# Patient Record
Sex: Female | Born: 1960 | Race: White | Hispanic: No | Smoking: Never smoker
Health system: Southern US, Community
[De-identification: ages and names within clinical notes are randomized; demographics above are authoritative.]

## PROBLEM LIST (undated history)

## (undated) DIAGNOSIS — F419 Anxiety disorder, unspecified: Secondary | ICD-10-CM

## (undated) HISTORY — PX: CHOLECYSTECTOMY: SHX55

## (undated) HISTORY — PX: TUBAL LIGATION: SHX77

## (undated) HISTORY — DX: Anxiety disorder, unspecified: F41.9

---

## 2001-02-18 ENCOUNTER — Other Ambulatory Visit: Admission: RE | Admit: 2001-02-18 | Discharge: 2001-02-18 | Payer: Self-pay | Admitting: Obstetrics & Gynecology

## 2002-04-12 ENCOUNTER — Other Ambulatory Visit: Admission: RE | Admit: 2002-04-12 | Discharge: 2002-04-12 | Payer: Self-pay | Admitting: Obstetrics & Gynecology

## 2003-06-13 ENCOUNTER — Other Ambulatory Visit: Admission: RE | Admit: 2003-06-13 | Discharge: 2003-06-13 | Payer: Self-pay | Admitting: Obstetrics & Gynecology

## 2004-06-23 ENCOUNTER — Other Ambulatory Visit: Admission: RE | Admit: 2004-06-23 | Discharge: 2004-06-23 | Payer: Self-pay | Admitting: Obstetrics & Gynecology

## 2005-06-24 ENCOUNTER — Other Ambulatory Visit: Admission: RE | Admit: 2005-06-24 | Discharge: 2005-06-24 | Payer: Self-pay | Admitting: Obstetrics & Gynecology

## 2008-09-07 HISTORY — PX: COLONOSCOPY: SHX174

## 2009-12-17 ENCOUNTER — Encounter: Admission: RE | Admit: 2009-12-17 | Discharge: 2009-12-17 | Payer: Self-pay | Admitting: Obstetrics & Gynecology

## 2016-11-05 ENCOUNTER — Ambulatory Visit
Admission: RE | Admit: 2016-11-05 | Discharge: 2016-11-05 | Disposition: A | Discharge status: Home / Self Care | Payer: BC Managed Care – PPO | Source: Ambulatory Visit | Attending: Internal Medicine | Admitting: Internal Medicine

## 2016-11-05 ENCOUNTER — Other Ambulatory Visit: Payer: Self-pay | Admitting: Internal Medicine

## 2016-11-05 DIAGNOSIS — R0781 Pleurodynia: Secondary | ICD-10-CM

## 2016-11-05 DIAGNOSIS — M5135 Other intervertebral disc degeneration, thoracolumbar region: Secondary | ICD-10-CM

## 2018-02-28 IMAGING — CR DG LUMBAR SPINE COMPLETE 4+V
5 series · 5 of 5 positions shown · non-contrast
Comparison: Coronal and sagittal images from an abdominal and
pelvic CT scan dated June 02, 2016

CLINICAL DATA: Status post fall from horse 15 years ago with
intermittent pain since then with increased symptoms over the past 6
months.

EXAM:
LUMBAR SPINE - COMPLETE 4+ VIEW

[t lumbar spine ap]
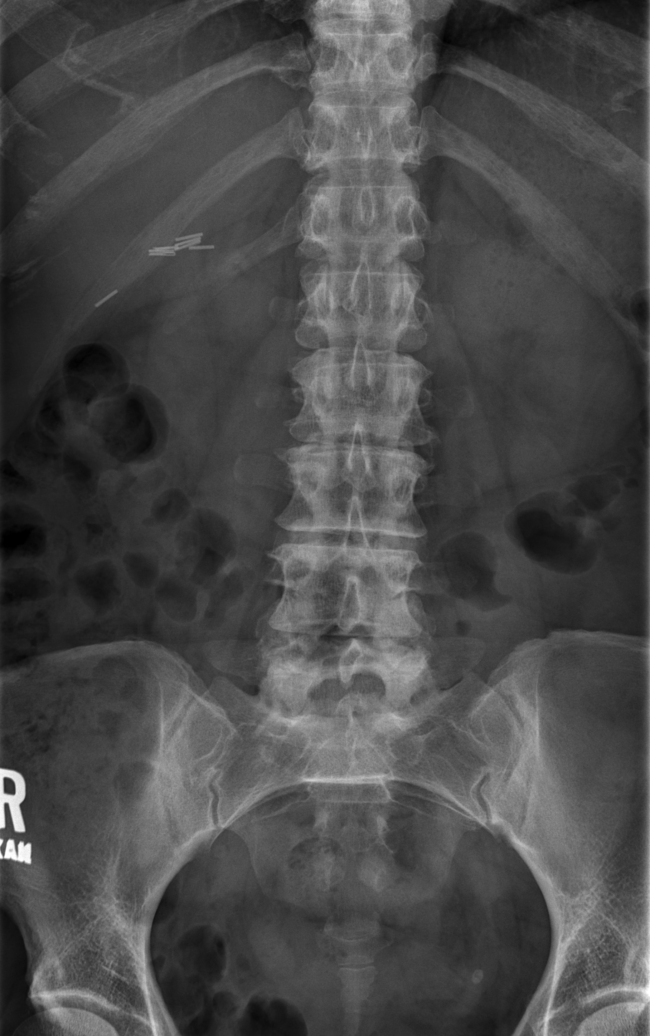

[t lumbar spine obl (1 of 2)]
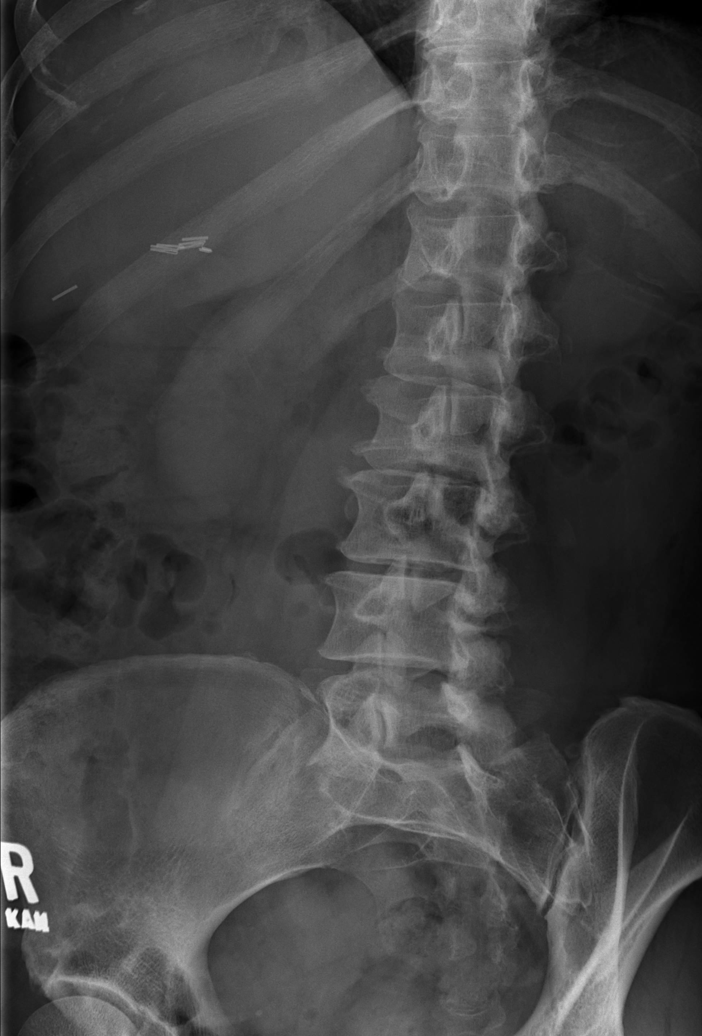

[t lumbar spine obl (2 of 2)]
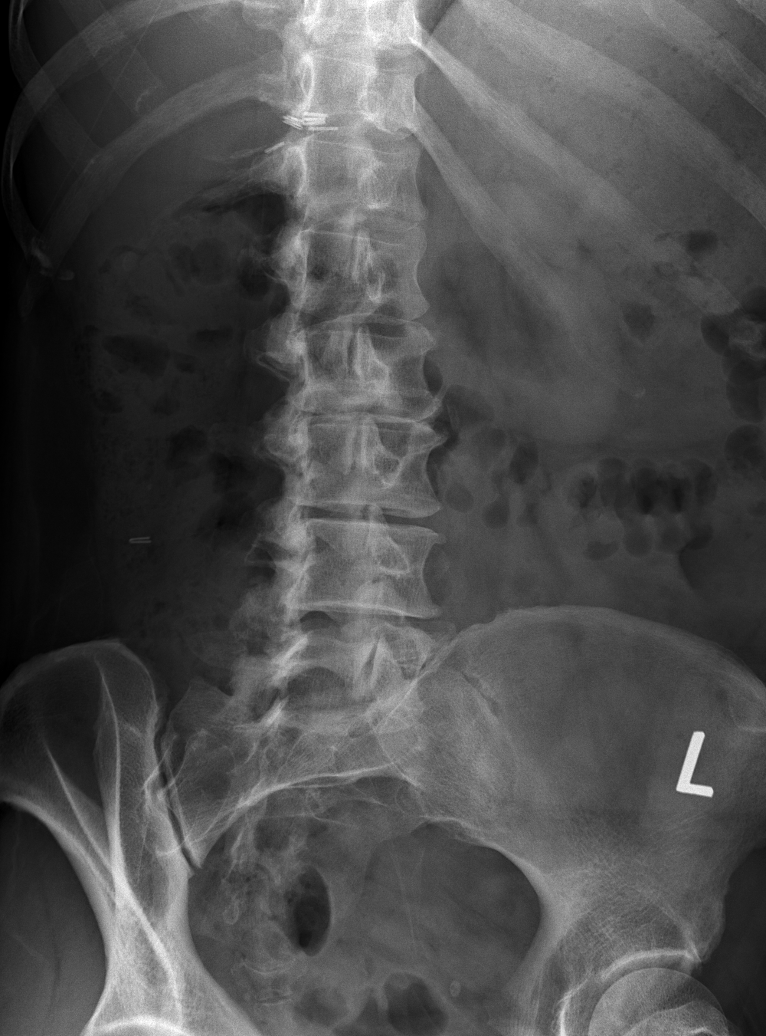

[t lumbar spine lat]
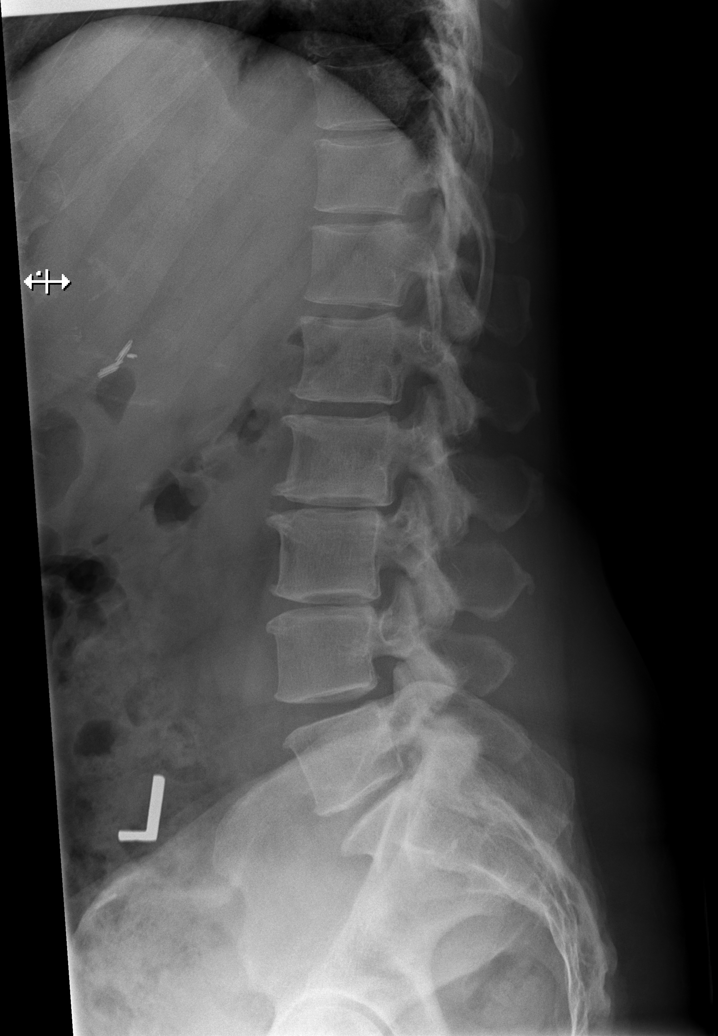

[t lumbar l-5 s-1 spot]
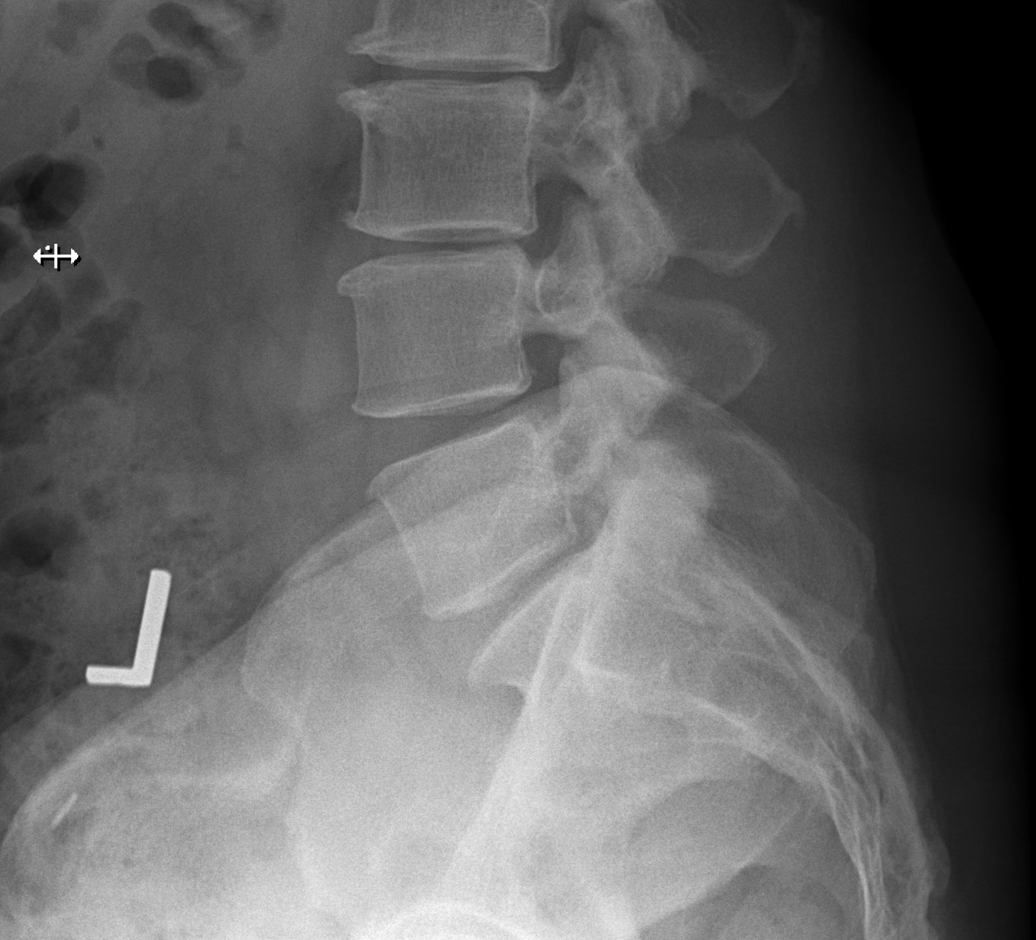

[5 of 5 positions shown; findings below may reference images not displayed]

FINDINGS: The lumbar vertebral bodies are preserved in height. There is disc
space narrowing at L1-2, L2-3, and L3-4. There are small anterior
endplate osteophytes at all lumbar levels. There is no
spondylolisthesis. There is mild facet joint hypertrophy at L5-S1.
The pedicles and transverse processes are intact. The observed
portions of the sacrum are normal.
IMPRESSION: There is multilevel degenerative disc disease of the lumbar spine.
There is no acute or old compression fracture. There is no
spondylolisthesis. If there are radicular symptoms, lumbar spine MRI
may be useful.

## 2018-02-28 IMAGING — CR DG RIBS W/ CHEST 3+V*R*
3 series · 3 of 3 positions shown · non-contrast
Comparison: Abdominal series of January 31, 2015

CLINICAL DATA: Status post fall from a worse with intermittent but
worsening pain. Symptoms are centered over the last palpable rib on
the right. No new injury.

EXAM:
RIGHT RIBS AND CHEST - 3+ VIEW

[t ribs ap lower right]
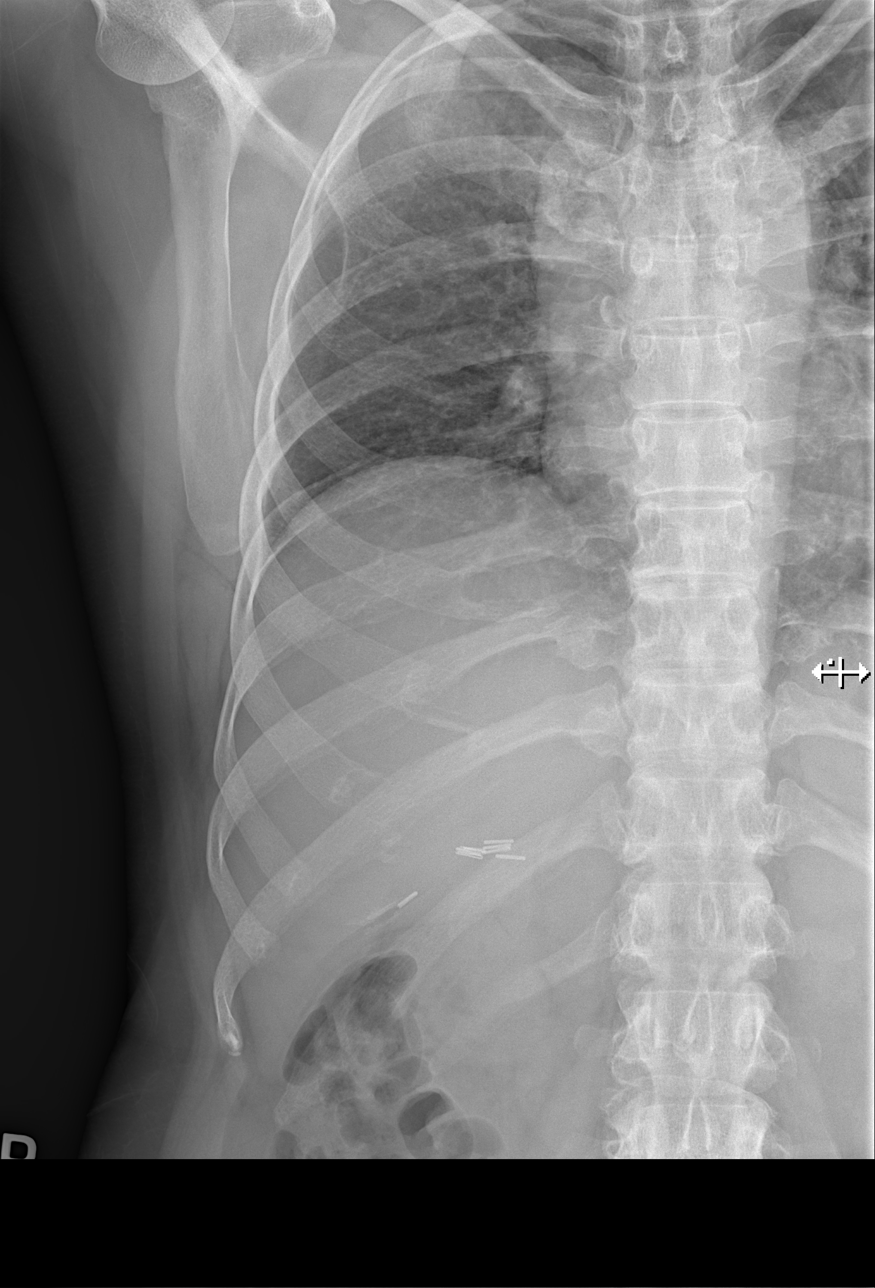

[t ribs rpo right]
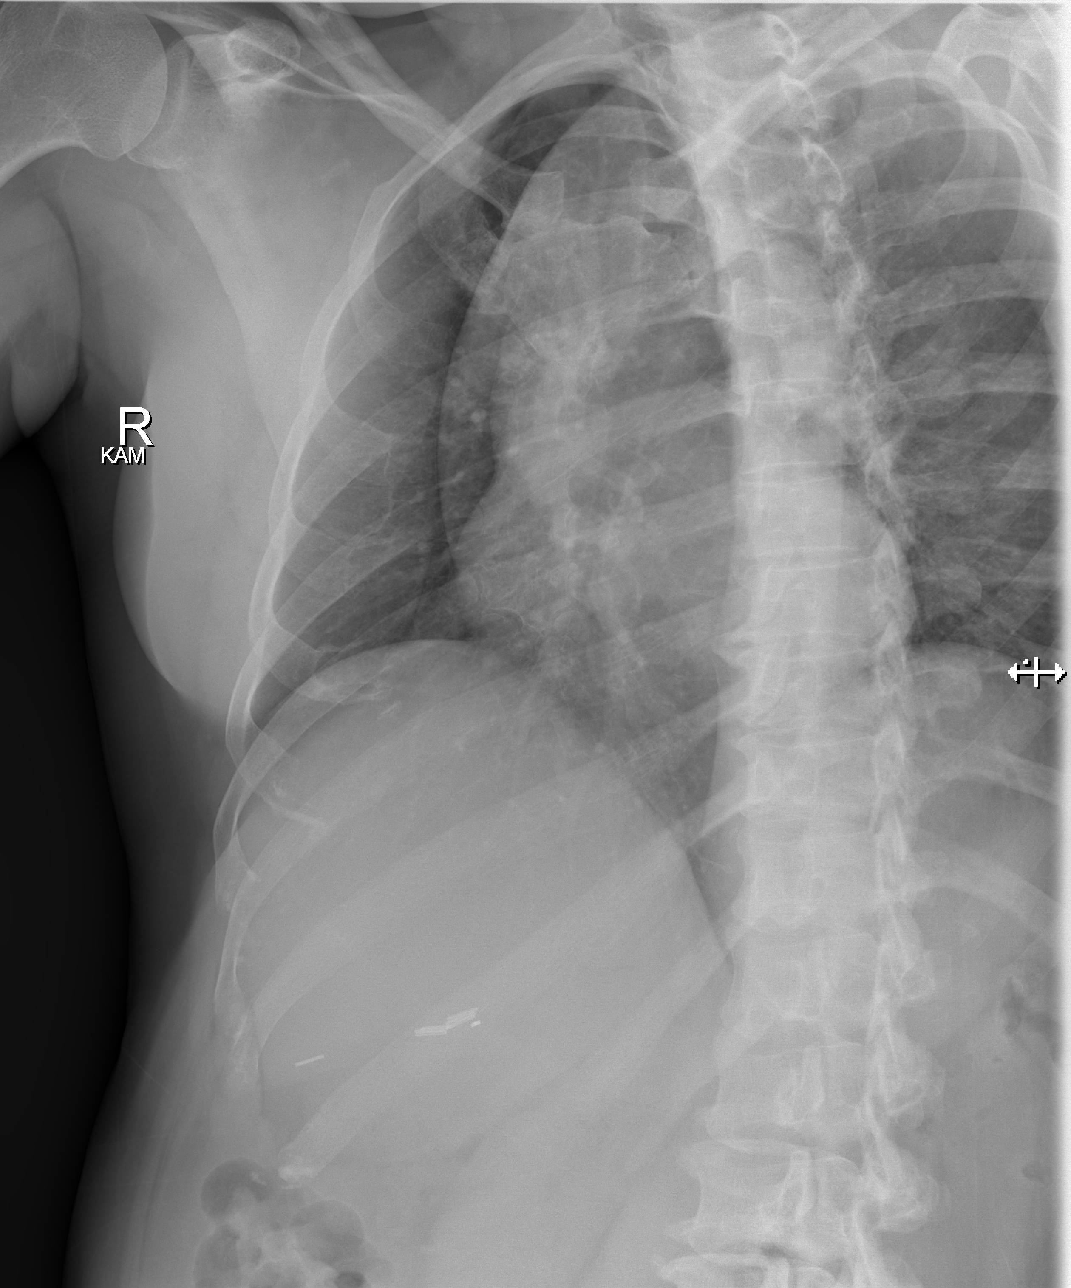

[w chest pa]
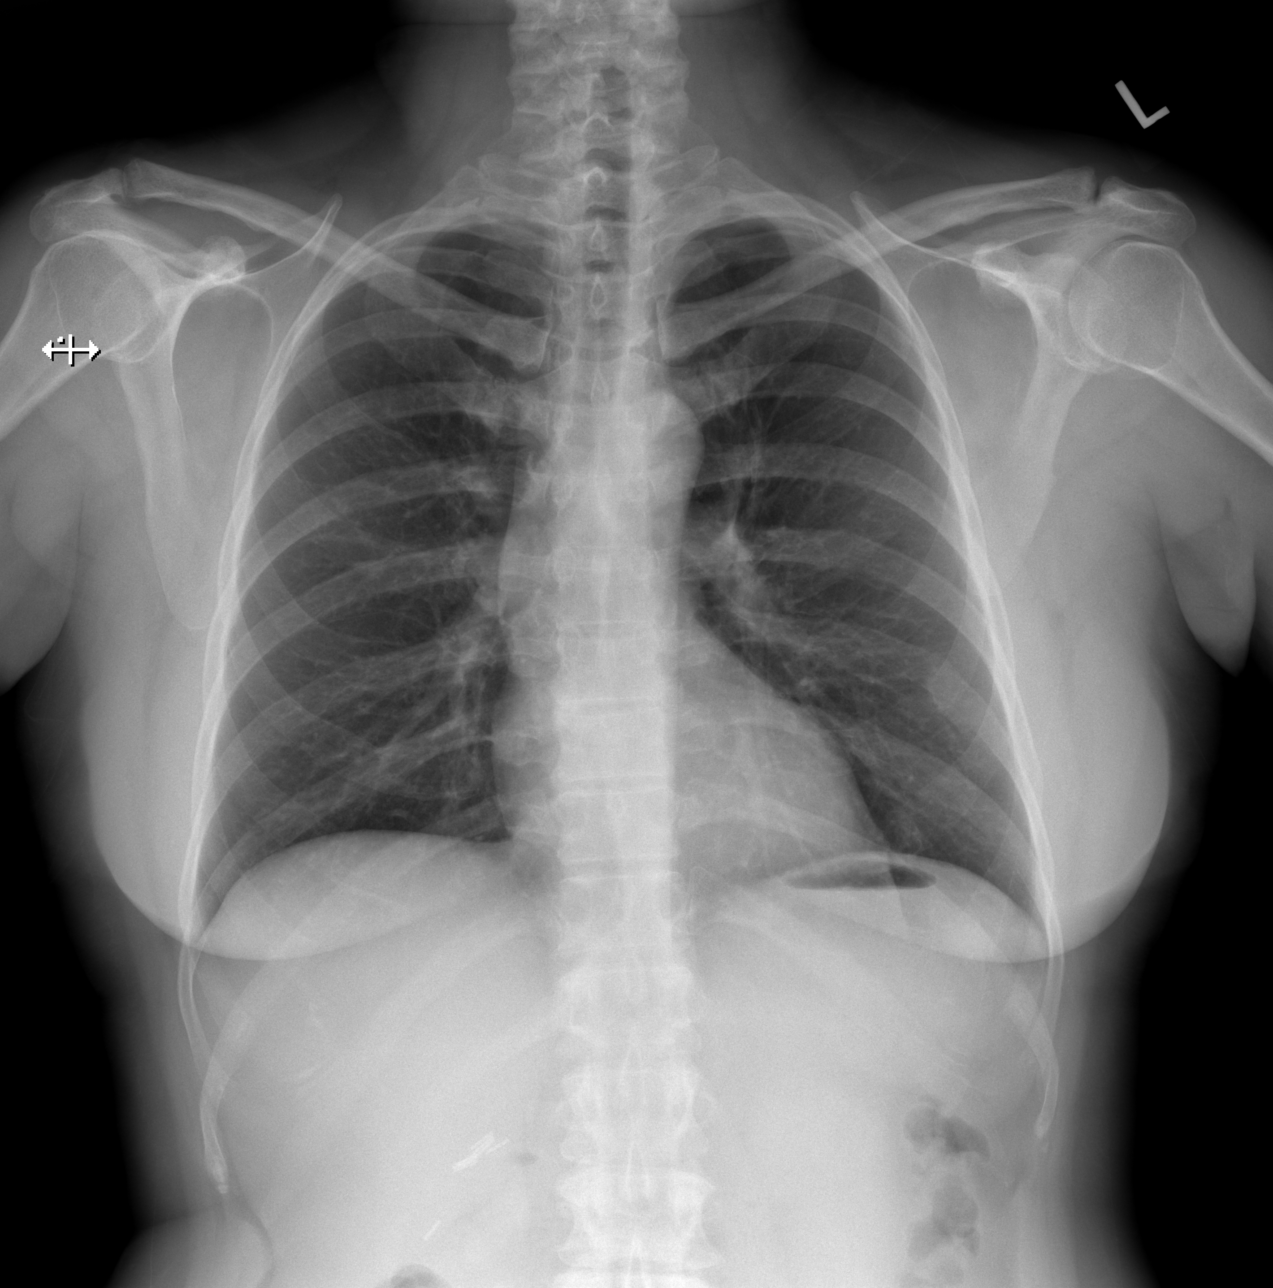

[3 of 3 positions shown; findings below may reference images not displayed]

FINDINGS: The right ribs are subjectively adequately mineralized. No acute or
old fracture is observed. There is no periosteal reaction or lytic
or blastic lesion. The chest x-ray reveals the lungs to be clear.
The heart and pulmonary vascularity are normal. The mediastinum is
normal in width. The clavicles are intact.
IMPRESSION: No acute or chronic abnormality of the right ribcage is observed.

## 2018-02-28 IMAGING — CR DG THORACIC SPINE 3V
3 series · 3 of 3 positions shown · non-contrast
Comparison: Chest x-ray of January 31, 2015

CLINICAL DATA: Status post fall from horse 15 years ago with
persistent pain at the level of last palpable rib on the right.

EXAM:
THORACIC SPINE - 3 VIEWS

[t thoracic breathing lat]
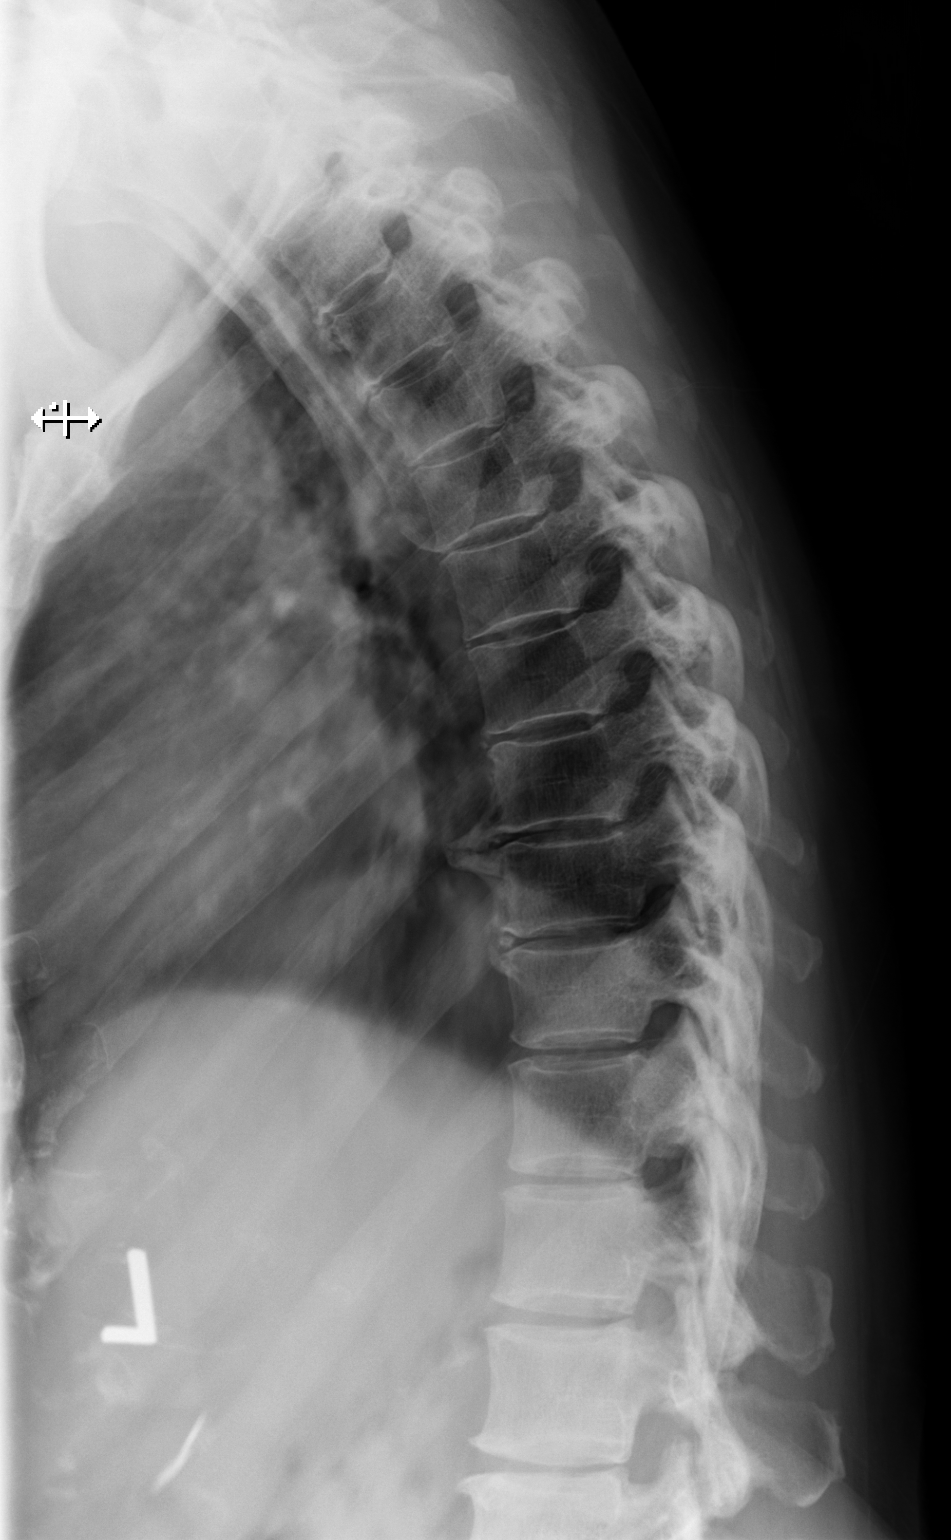

[t thoracic swimmers]
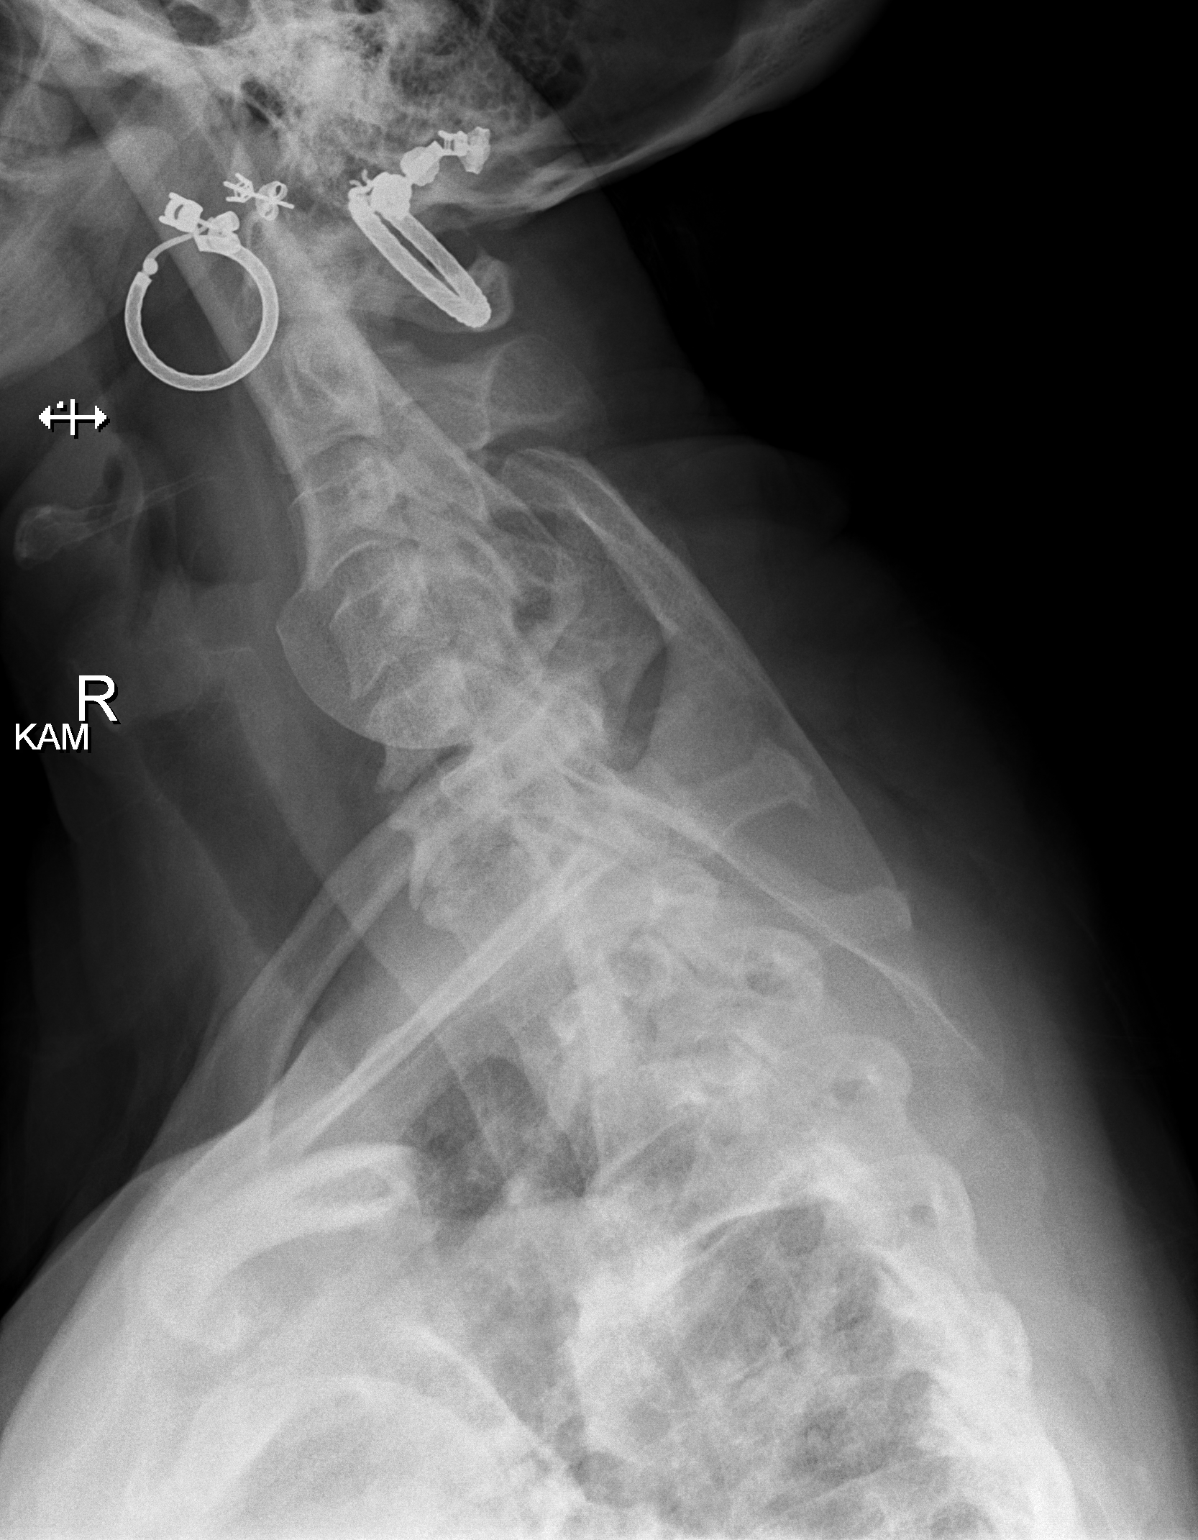

[t thoracic spine ap]
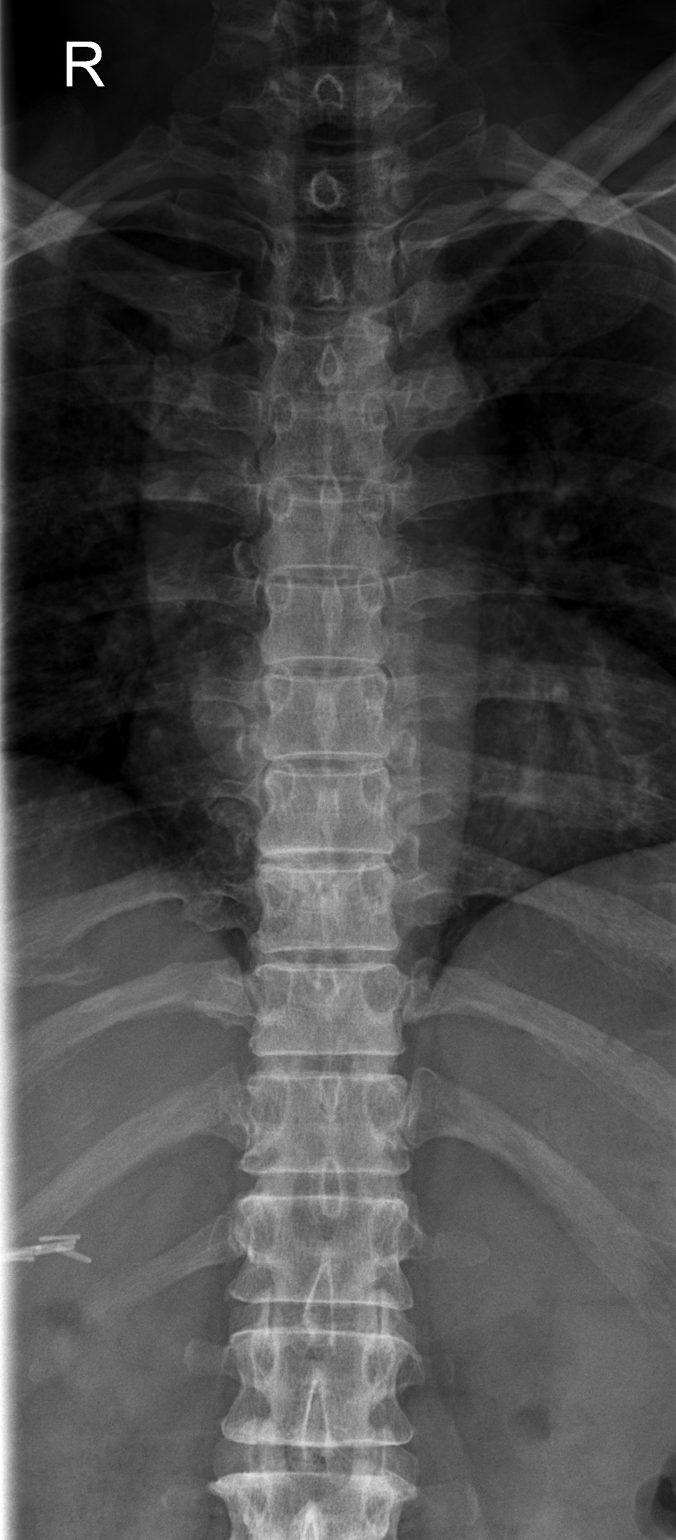

[3 of 3 positions shown; findings below may reference images not displayed]

FINDINGS: The thoracic vertebral bodies are preserved in height. There are
large anterior endplate osteophytes at T9-10 and smaller ones at
T10-11. There is no spondylolisthesis. The disc space heights are
reasonably well-maintained. There are mild degenerative changes of
the lower cervical spine. The cervicothoracic junction appears
normal. The pedicles of the thoracic spine are intact. There are no
abnormal paravertebral soft tissue densities.
IMPRESSION: There is no acute bony abnormality of the thoracic spine. There is
moderate endplate spurring at T9-10 and T10-11.

## 2019-03-21 ENCOUNTER — Other Ambulatory Visit: Payer: Self-pay | Admitting: Obstetrics & Gynecology

## 2019-03-21 DIAGNOSIS — N644 Mastodynia: Secondary | ICD-10-CM

## 2019-04-20 ENCOUNTER — Other Ambulatory Visit: Payer: Self-pay

## 2019-04-20 ENCOUNTER — Ambulatory Visit
Admission: RE | Admit: 2019-04-20 | Discharge: 2019-04-20 | Disposition: A | Discharge status: Home / Self Care | Payer: BC Managed Care – PPO | Source: Ambulatory Visit | Attending: Obstetrics & Gynecology | Admitting: Obstetrics & Gynecology

## 2019-04-20 ENCOUNTER — Ambulatory Visit: Admission: RE | Admit: 2019-04-20 | Payer: BC Managed Care – PPO | Source: Ambulatory Visit

## 2019-04-20 DIAGNOSIS — N644 Mastodynia: Secondary | ICD-10-CM

## 2020-04-04 ENCOUNTER — Other Ambulatory Visit: Payer: Self-pay | Admitting: Obstetrics & Gynecology

## 2020-04-04 DIAGNOSIS — Z1231 Encounter for screening mammogram for malignant neoplasm of breast: Secondary | ICD-10-CM

## 2020-04-22 ENCOUNTER — Ambulatory Visit: Payer: BC Managed Care – PPO

## 2020-05-07 ENCOUNTER — Ambulatory Visit
Admission: RE | Admit: 2020-05-07 | Discharge: 2020-05-07 | Disposition: A | Discharge status: Home / Self Care | Payer: BC Managed Care – PPO | Source: Ambulatory Visit | Attending: Obstetrics & Gynecology | Admitting: Obstetrics & Gynecology

## 2020-05-07 ENCOUNTER — Other Ambulatory Visit: Payer: Self-pay

## 2020-05-07 DIAGNOSIS — Z1231 Encounter for screening mammogram for malignant neoplasm of breast: Secondary | ICD-10-CM

## 2020-05-29 ENCOUNTER — Telehealth: Payer: Self-pay | Admitting: Gastroenterology

## 2020-05-29 NOTE — Telephone Encounter (Signed)
Received previous colon report and going to place on Dr. Gupta's desk for review °

## 2020-07-03 NOTE — Telephone Encounter (Signed)
Spoke with pt, will have last colon refaxed

## 2020-07-03 NOTE — Telephone Encounter (Signed)
LM on vmail for patient to call back.  We cannot locate colon report and need for patient to refax last colon report

## 2020-07-04 NOTE — Telephone Encounter (Signed)
Proceed with direct colonoscopy. Previous patient of Dr. Charm Barges.  Had colonoscopy 2016 with small polyps RG

## 2020-07-04 NOTE — Telephone Encounter (Signed)
Hey Dr Chales Abrahams, we just received the pt's last colon report, I will send to you for review, please advise on scheduling

## 2020-07-12 ENCOUNTER — Encounter: Payer: Self-pay | Admitting: Gastroenterology

## 2020-08-07 ENCOUNTER — Ambulatory Visit (AMBULATORY_SURGERY_CENTER): Payer: Self-pay

## 2020-08-07 ENCOUNTER — Other Ambulatory Visit: Payer: Self-pay

## 2020-08-07 VITALS — Ht 63.5 in | Wt 152.0 lb

## 2020-08-07 DIAGNOSIS — Z1211 Encounter for screening for malignant neoplasm of colon: Secondary | ICD-10-CM

## 2020-08-07 DIAGNOSIS — Z01818 Encounter for other preprocedural examination: Secondary | ICD-10-CM

## 2020-08-07 MED ORDER — SUTAB 1479-225-188 MG PO TABS: 12.0000 | ORAL_TABLET | ORAL | 0 refills | Status: DC

## 2020-08-07 NOTE — Progress Notes (Signed)
No allergies to soy or egg Pt is not on blood thinners  Denies issues with sedation/intubation Denies atrial flutter/fib Denies constipation    Pt is aware of Covid safety and care partner requirements.   Is on Phentermine-pt is aware she must stop it 10 prior to procedure.

## 2020-08-09 ENCOUNTER — Encounter: Payer: Self-pay | Admitting: Gastroenterology

## 2020-08-19 ENCOUNTER — Other Ambulatory Visit: Payer: Self-pay | Admitting: Gastroenterology

## 2020-08-19 LAB — SARS CORONAVIRUS 2 (TAT 6-24 HRS): SARS Coronavirus 2: NEGATIVE

## 2020-08-22 ENCOUNTER — Other Ambulatory Visit: Payer: Self-pay

## 2020-08-22 ENCOUNTER — Encounter: Payer: Self-pay | Admitting: Gastroenterology

## 2020-08-22 ENCOUNTER — Ambulatory Visit (AMBULATORY_SURGERY_CENTER): Payer: BC Managed Care – PPO | Admitting: Gastroenterology

## 2020-08-22 VITALS — BP 132/83 | HR 67 | Temp 97.3°F | Resp 16 | Ht 63.5 in | Wt 152.0 lb

## 2020-08-22 DIAGNOSIS — Z1211 Encounter for screening for malignant neoplasm of colon: Secondary | ICD-10-CM

## 2020-08-22 MED ORDER — SODIUM CHLORIDE 0.9 % IV SOLN: 500.0000 mL | Freq: Once | INTRAVENOUS | Status: DC

## 2020-08-22 NOTE — Patient Instructions (Signed)
YOU HAD AN ENDOSCOPIC PROCEDURE TODAY AT THE [facility]:   Refer to the procedure report that was given to you for any specific questions about what was found during the examination.  If the procedure report does not answer your questions, please call your gastroenterologist to clarify.  If you requested that your care partner not be given the details of your procedure findings, then the procedure report has been included in a sealed envelope for you to review at your convenience later.  YOU SHOULD EXPECT: Some feelings of bloating in the abdomen. Passage of more gas than usual.  Walking can help get rid of the air that was put into your GI tract during the procedure and reduce the bloating. If you had a lower endoscopy (such as a colonoscopy or flexible sigmoidoscopy) you may notice spotting of blood in your stool or on the toilet paper. If you underwent a bowel prep for your procedure, you may not have a normal bowel movement for a few days.  Please Note:  You might notice some irritation and congestion in your nose or some drainage.  This is from the oxygen used during your procedure.  There is no need for concern and it should clear up in a day or so.  SYMPTOMS TO REPORT IMMEDIATELY:   Following lower endoscopy (colonoscopy or flexible sigmoidoscopy):  Excessive amounts of blood in the stool  Significant tenderness or worsening of abdominal pains  Swelling of the abdomen that is new, acute  Fever of 100F or higher   Following upper endoscopy (EGD)  Vomiting of blood or coffee ground material  New chest pain or pain under the shoulder blades  Painful or persistently difficult swallowing  New shortness of breath  Fever of 100F or higher  Black, tarry-looking stools  For urgent or emergent issues, a gastroenterologist can be reached at any hour by calling [phone]. Do not use MyChart messaging for urgent concerns.    DIET:  We do recommend a small meal at first, but  then you may proceed to your regular diet.  Drink plenty of fluids but you should avoid alcoholic beverages for 24 hours.  ACTIVITY:  You should plan to take it easy for the rest of today and you should NOT DRIVE or use heavy machinery until tomorrow (because of the sedation medicines used during the test).    FOLLOW UP: Our staff will call the number listed on your records 48-72 hours following your procedure to check on you and address any questions or concerns that you may have regarding the information given to you following your procedure. If we do not reach you, we will leave a message.  We will attempt to reach you two times.  During this call, we will ask if you have developed any symptoms of COVID 19. If you develop any symptoms (ie: fever, flu-like symptoms, shortness of breath, cough etc.) before then, please call [phone].  If you test positive for Covid 19 in the 2 weeks post procedure, please call and report this information to us.    If any biopsies were taken you will be contacted by phone or by letter within the next 1-3 weeks.  Please call us at [phone] if you have not heard about the biopsies in 3 weeks.    SIGNATURES/CONFIDENTIALITY: You and/or your care partner have signed paperwork which will be entered into your electronic medical record.  These signatures attest to the fact that that the information above on   your After Visit Summary has been reviewed and is understood.  Full responsibility of the confidentiality of this discharge information lies with you and/or your care-partner. 

## 2020-08-22 NOTE — Op Note (Signed)
[facility] Patient Name: Christine Small Procedure Date: 08/22/2020 8:50 AM MRN: [id number] Endoscopist: Lynann Bologna , MD Age: 60 Referring MD:  Date of Birth: 26-Sep-1960 Gender: Female Account #: [id number] Procedure:                Colonoscopy Indications:              Screening for colorectal malignant neoplasm Medicines:                Monitored Anesthesia Care Procedure:                Pre-Anesthesia Assessment:                           - Prior to the procedure, a History and Physical                            was performed, and patient medications and                            allergies were reviewed. The patient's tolerance of                            previous anesthesia was also reviewed. The risks                            and benefits of the procedure and the sedation                            options and risks were discussed with the patient.                            All questions were answered, and informed consent                            was obtained. Prior Anticoagulants: The patient has                            taken no previous anticoagulant or antiplatelet                            agents. ASA Grade Assessment: II - A patient with                            mild systemic disease. After reviewing the risks                            and benefits, the patient was deemed in                            satisfactory condition to undergo the procedure.                           After obtaining informed consent, the colonoscope  was passed under direct vision. Throughout the                            procedure, the patient's blood pressure, pulse, and                            oxygen saturations were monitored continuously. The                            Olympus PFC-[id number] (#[id number]) Colonoscope was                            introduced through the anus and advanced to the 2                            cm into the ileum.  The colonoscopy was performed                            without difficulty. The patient tolerated the                            procedure well. The quality of the bowel                            preparation was good. The terminal ileum, ileocecal                            valve, appendiceal orifice, and rectum were                            photographed. Scope In: 8:57:01 AM Scope Out: 9:08:26 AM Scope Withdrawal Time: 0 hours 9 minutes 20 seconds  Total Procedure Duration: 0 hours 11 minutes 25 seconds  Findings:                 A few small-mouthed diverticula were found in the                            sigmoid colon.                           Non-bleeding internal hemorrhoids were found during                            retroflexion. The hemorrhoids were small.                           The terminal ileum appeared normal.                           The exam was otherwise without abnormality on                            direct and retroflexion views. Complications:            No immediate complications. Estimated Blood Loss:  Estimated blood loss: none. Impression:               - Mild sigmoid diverticulosis.                           - Non-bleeding internal hemorrhoids.                           - Otherwise normal colonoscopy to TI.                           - No specimens collected. Recommendation:           - Patient has a contact number available for                            emergencies. The signs and symptoms of potential                            delayed complications were discussed with the                            patient. Return to normal activities tomorrow.                            Written discharge instructions were provided to the                            patient.                           - High fiber diet.                           - Continue present medications.                           - Repeat colonoscopy in 10 years for screening                             purposes. Earlier, if with any new problems or                            change in family history.                           - Return to GI office PRN.                           - The findings and recommendations were discussed                            with the patient's family. Lynann Bologna, MD 08/22/2020 9:11:48 AM This report has been signed electronically.

## 2020-08-22 NOTE — Progress Notes (Signed)
VS- Christine Small  Pt's states no medical or surgical changes since previsit or office visit.  Pt states she "took sips of water, Sprite and coffee" on at her way- about 645.  Gilmore Laroche CRNA made aware and will be doing pt after her scheduled time.  Pt is aware- she states "it was just a few sips.  Maybe I should leave."  I explained we are postponing for her safety.  She is agreeable and I let her care partner know.

## 2020-08-22 NOTE — Progress Notes (Signed)
PT taken to PACU. Monitors in place. VSS. Report given to RN. 

## 2020-08-26 ENCOUNTER — Telehealth: Payer: Self-pay

## 2020-08-26 NOTE — Telephone Encounter (Signed)
  Follow up Call-  Call back number 08/22/2020  Post procedure Call Back phone  # [phone]  Permission to leave phone message Yes  Some recent data might be hidden     Patient questions:  Do you have a fever, pain , or abdominal swelling? No. Pain Score  0 *  Have you tolerated food without any problems? Yes.    Have you been able to return to your normal activities? Yes.    Do you have any questions about your discharge instructions: Diet   No. Medications  No. Follow up visit  No.  Do you have questions or concerns about your Care? No.  Actions: * If pain score is 4 or above: No action needed, pain <4.  1. Have you developed a fever since your procedure? no  2.   Have you had an respiratory symptoms (SOB or cough) since your procedure? no  3.   Have you tested positive for COVID 19 since your procedure no  4.   Have you had any family members/close contacts diagnosed with the COVID 19 since your procedure?  no   If yes to any of these questions please route to Laverna Peace, RN and Karlton Lemon, RN

## 2021-07-07 ENCOUNTER — Other Ambulatory Visit: Payer: Self-pay | Admitting: Obstetrics & Gynecology

## 2021-07-07 DIAGNOSIS — Z1231 Encounter for screening mammogram for malignant neoplasm of breast: Secondary | ICD-10-CM

## 2021-08-12 ENCOUNTER — Ambulatory Visit
Admission: RE | Admit: 2021-08-12 | Discharge: 2021-08-12 | Disposition: A | Discharge status: Home / Self Care | Payer: BC Managed Care – PPO | Source: Ambulatory Visit | Attending: Obstetrics & Gynecology | Admitting: Obstetrics & Gynecology

## 2021-08-12 ENCOUNTER — Other Ambulatory Visit: Payer: Self-pay

## 2021-08-12 DIAGNOSIS — Z1231 Encounter for screening mammogram for malignant neoplasm of breast: Secondary | ICD-10-CM

## 2022-08-11 ENCOUNTER — Other Ambulatory Visit: Payer: Self-pay | Admitting: Obstetrics & Gynecology

## 2022-08-11 DIAGNOSIS — Z1231 Encounter for screening mammogram for malignant neoplasm of breast: Secondary | ICD-10-CM

## 2022-10-06 ENCOUNTER — Ambulatory Visit
Admission: RE | Admit: 2022-10-06 | Discharge: 2022-10-06 | Disposition: A | Discharge status: Home / Self Care | Payer: BC Managed Care – PPO | Source: Ambulatory Visit | Attending: Obstetrics & Gynecology | Admitting: Obstetrics & Gynecology

## 2022-10-06 DIAGNOSIS — Z1231 Encounter for screening mammogram for malignant neoplasm of breast: Secondary | ICD-10-CM

## 2023-11-09 ENCOUNTER — Other Ambulatory Visit: Payer: Self-pay | Admitting: Obstetrics & Gynecology

## 2023-11-09 DIAGNOSIS — Z0000 Encounter for general adult medical examination without abnormal findings: Secondary | ICD-10-CM

## 2023-11-22 ENCOUNTER — Ambulatory Visit
Admission: RE | Admit: 2023-11-22 | Discharge: 2023-11-22 | Disposition: A | Discharge status: Home / Self Care | Payer: Self-pay | Source: Ambulatory Visit | Attending: Obstetrics & Gynecology | Admitting: Obstetrics & Gynecology

## 2023-11-22 DIAGNOSIS — Z0000 Encounter for general adult medical examination without abnormal findings: Secondary | ICD-10-CM

## 2024-03-29 ENCOUNTER — Other Ambulatory Visit: Payer: Self-pay | Admitting: Orthopedic Surgery

## 2024-03-29 DIAGNOSIS — M25561 Pain in right knee: Secondary | ICD-10-CM

## 2024-05-04 ENCOUNTER — Ambulatory Visit: Payer: Self-pay | Admitting: Orthopedic Surgery

## 2024-05-12 ENCOUNTER — Encounter (HOSPITAL_COMMUNITY)

## 2024-05-19 ENCOUNTER — Ambulatory Visit (HOSPITAL_COMMUNITY): Admit: 2024-05-19 | Admitting: Specialist

## 2024-05-19 SURGERY — ARTHROSCOPY, KNEE, WITH MEDIAL MENISCECTOMY
Anesthesia: General | Site: Knee | Laterality: Right
